# Patient Record
Sex: Male | Born: 1965 | Race: White | Hispanic: No | Marital: Married | Smoking: Former smoker
Health system: Southern US, Community
[De-identification: ages and names within clinical notes are randomized; demographics above are authoritative.]

## PROBLEM LIST (undated history)

## (undated) DIAGNOSIS — J302 Other seasonal allergic rhinitis: Secondary | ICD-10-CM

## (undated) DIAGNOSIS — F32A Depression, unspecified: Secondary | ICD-10-CM

## (undated) DIAGNOSIS — M5020 Other cervical disc displacement, unspecified cervical region: Secondary | ICD-10-CM

## (undated) DIAGNOSIS — E785 Hyperlipidemia, unspecified: Secondary | ICD-10-CM

## (undated) HISTORY — DX: Other cervical disc displacement, unspecified cervical region: M50.20

## (undated) HISTORY — DX: Other seasonal allergic rhinitis: J30.2

## (undated) HISTORY — DX: Hyperlipidemia, unspecified: E78.5

## (undated) HISTORY — DX: Depression, unspecified: F32.A

---

## 2016-03-27 HISTORY — PX: PARTIAL COLECTOMY: SHX5273

## 2016-04-15 DIAGNOSIS — K635 Polyp of colon: Secondary | ICD-10-CM | POA: Insufficient documentation

## 2016-06-08 DIAGNOSIS — C61 Malignant neoplasm of prostate: Secondary | ICD-10-CM | POA: Insufficient documentation

## 2016-06-08 HISTORY — DX: Malignant neoplasm of prostate: C61

## 2018-09-15 ENCOUNTER — Other Ambulatory Visit (HOSPITAL_COMMUNITY): Payer: Self-pay | Admitting: Sports Medicine

## 2018-09-15 ENCOUNTER — Other Ambulatory Visit: Payer: Self-pay | Admitting: Sports Medicine

## 2018-09-15 DIAGNOSIS — M5030 Other cervical disc degeneration, unspecified cervical region: Secondary | ICD-10-CM

## 2018-09-15 DIAGNOSIS — M47812 Spondylosis without myelopathy or radiculopathy, cervical region: Secondary | ICD-10-CM

## 2018-09-15 DIAGNOSIS — M542 Cervicalgia: Secondary | ICD-10-CM

## 2018-09-21 ENCOUNTER — Other Ambulatory Visit: Payer: Self-pay | Admitting: Sports Medicine

## 2018-09-21 DIAGNOSIS — M5030 Other cervical disc degeneration, unspecified cervical region: Secondary | ICD-10-CM

## 2018-09-21 DIAGNOSIS — M47812 Spondylosis without myelopathy or radiculopathy, cervical region: Secondary | ICD-10-CM

## 2018-09-21 DIAGNOSIS — M542 Cervicalgia: Secondary | ICD-10-CM

## 2018-09-23 ENCOUNTER — Ambulatory Visit
Admission: RE | Admit: 2018-09-23 | Discharge: 2018-09-23 | Disposition: A | Discharge status: Home / Self Care | Payer: BC Managed Care – PPO | Source: Ambulatory Visit | Attending: Sports Medicine | Admitting: Sports Medicine

## 2018-09-23 DIAGNOSIS — M542 Cervicalgia: Secondary | ICD-10-CM | POA: Insufficient documentation

## 2018-09-23 DIAGNOSIS — M47812 Spondylosis without myelopathy or radiculopathy, cervical region: Secondary | ICD-10-CM

## 2018-09-23 DIAGNOSIS — M5030 Other cervical disc degeneration, unspecified cervical region: Secondary | ICD-10-CM

## 2019-03-17 IMAGING — CR DG ORBITS FOR FOREIGN BODY
2 series · 2 of 2 positions shown · non-contrast
Comparison: None.

CLINICAL DATA: Metal working/exposure; clearance prior to MRI

EXAM:
ORBITS FOR FOREIGN BODY - 2 VIEW

[orbits waters (1 of 2)]
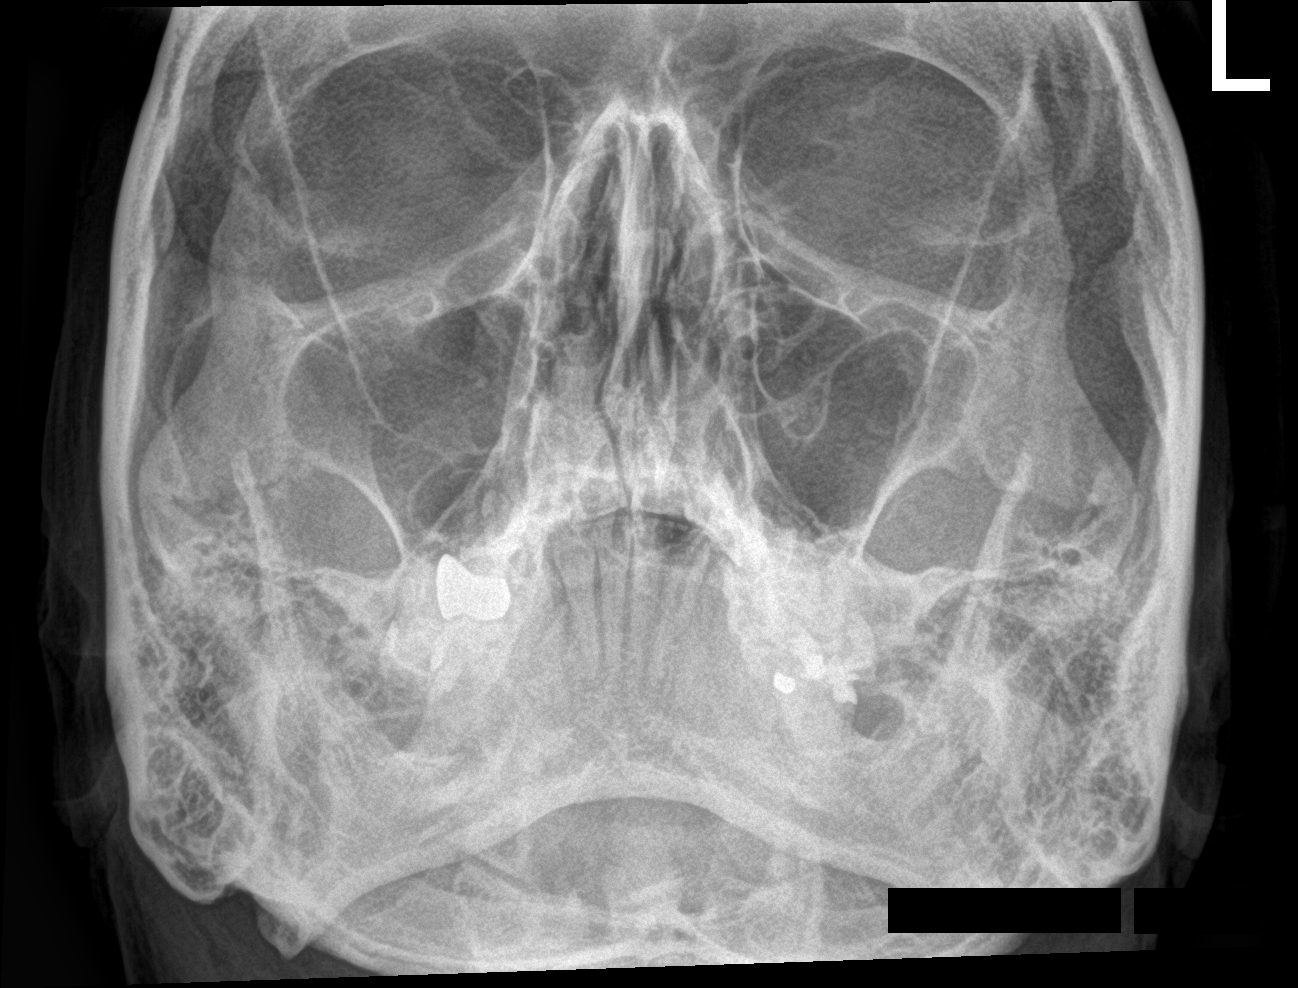

[orbits waters (2 of 2)]
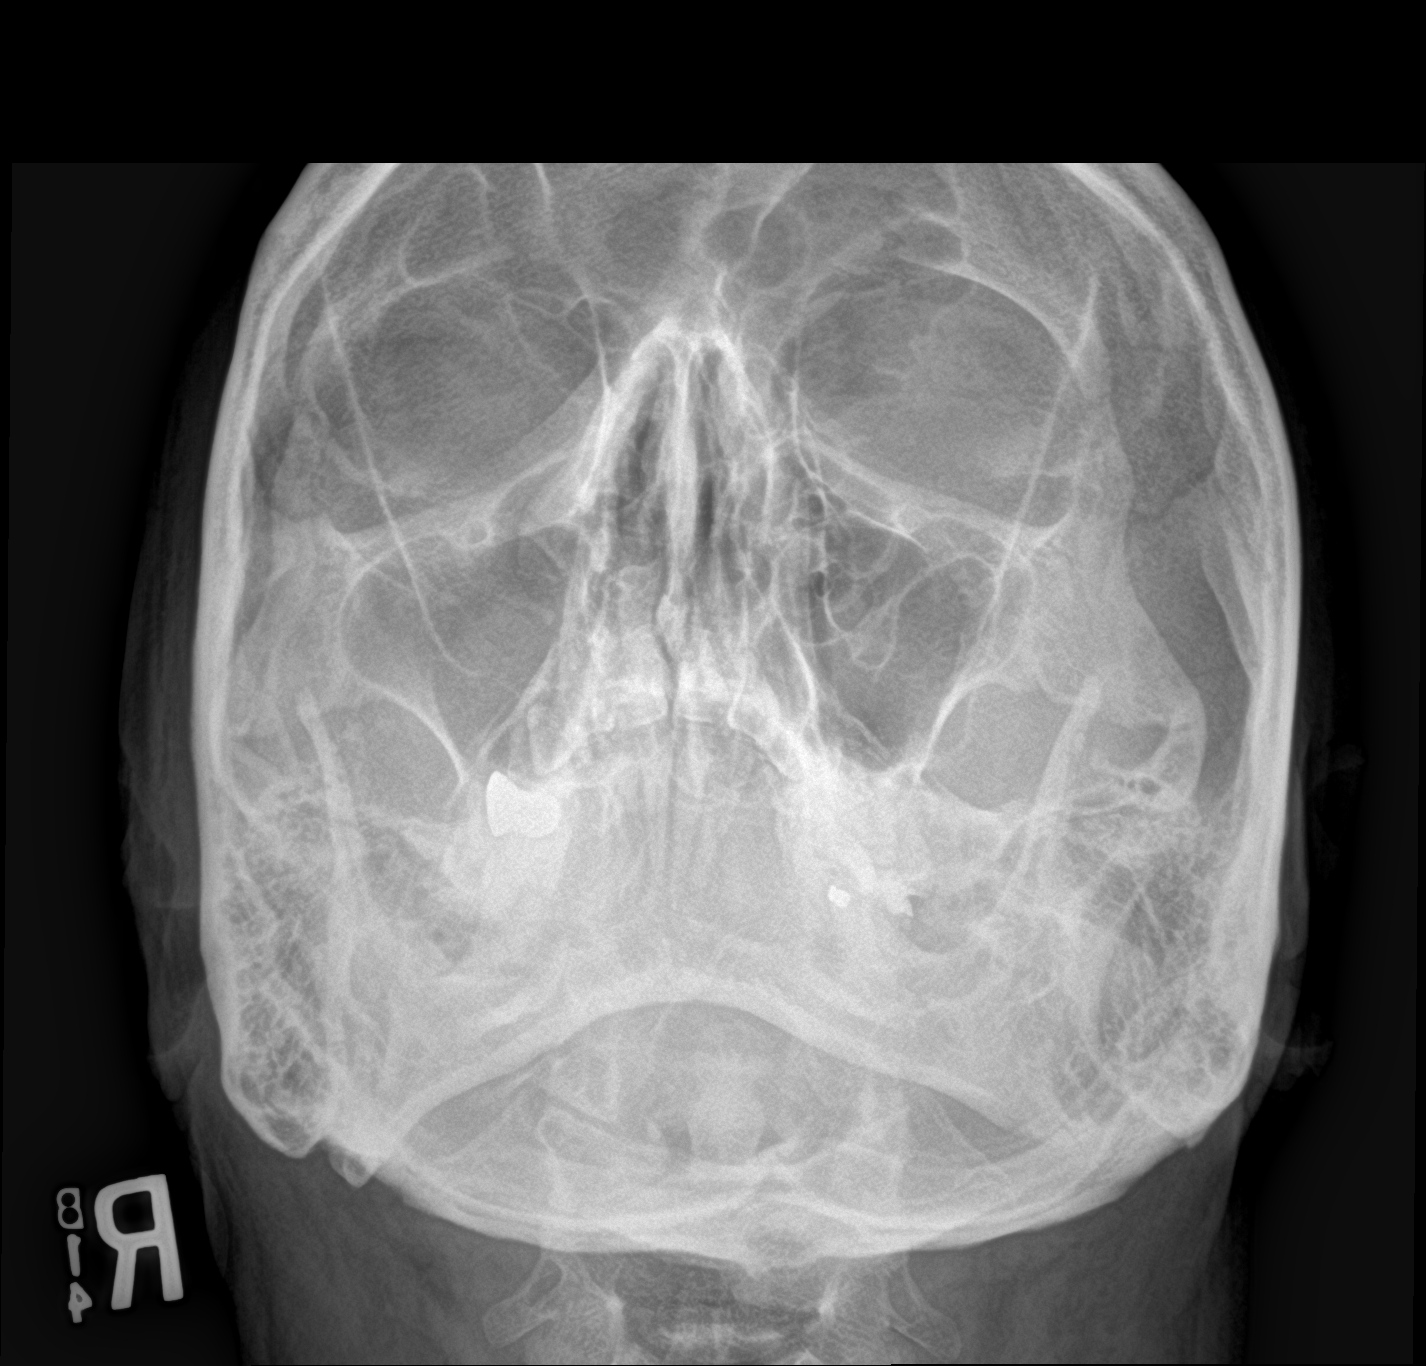

[2 of 2 positions shown; findings below may reference images not displayed]

FINDINGS: There is no evidence of metallic foreign body within the orbits. No
significant bone abnormality identified.
IMPRESSION: No evidence of metallic foreign body within the orbits.

## 2022-09-24 ENCOUNTER — Encounter: Payer: Self-pay | Admitting: Family

## 2022-09-24 ENCOUNTER — Ambulatory Visit (INDEPENDENT_AMBULATORY_CARE_PROVIDER_SITE_OTHER): Payer: BC Managed Care – PPO | Admitting: Family

## 2022-09-24 VITALS — BP 128/70 | HR 67 | Ht 73.0 in | Wt 240.0 lb

## 2022-09-24 DIAGNOSIS — Z6831 Body mass index (BMI) 31.0-31.9, adult: Secondary | ICD-10-CM

## 2022-09-24 DIAGNOSIS — E039 Hypothyroidism, unspecified: Secondary | ICD-10-CM

## 2022-09-24 DIAGNOSIS — I10 Essential (primary) hypertension: Secondary | ICD-10-CM

## 2022-09-24 DIAGNOSIS — R7303 Prediabetes: Secondary | ICD-10-CM

## 2022-09-24 DIAGNOSIS — E538 Deficiency of other specified B group vitamins: Secondary | ICD-10-CM

## 2022-09-24 DIAGNOSIS — C61 Malignant neoplasm of prostate: Secondary | ICD-10-CM

## 2022-09-24 DIAGNOSIS — E782 Mixed hyperlipidemia: Secondary | ICD-10-CM

## 2022-09-24 DIAGNOSIS — E559 Vitamin D deficiency, unspecified: Secondary | ICD-10-CM

## 2022-09-24 DIAGNOSIS — G43119 Migraine with aura, intractable, without status migrainosus: Secondary | ICD-10-CM

## 2022-09-24 HISTORY — DX: Body mass index (BMI) 31.0-31.9, adult: Z68.31

## 2022-09-24 MED ORDER — ROSUVASTATIN CALCIUM 10 MG PO TABS: 10.0000 mg | ORAL_TABLET | Freq: Every day | ORAL | 1 refills | Status: AC

## 2022-09-24 MED ORDER — NURTEC 75 MG PO TBDP: 75.0000 mg | ORAL_TABLET | ORAL | 3 refills | Status: DC | PRN

## 2022-09-24 NOTE — Assessment & Plan Note (Signed)
Checking PSA today.  Will inform pt. Of results.

## 2022-09-24 NOTE — Assessment & Plan Note (Signed)
Nurtec sample given in office.  Will send refills for pt so he can take QOD for prevention.

## 2022-09-24 NOTE — Assessment & Plan Note (Signed)
Checking lipid panel today.  Will send refills for rosuvastatin.   Will consider increase if not well managed.

## 2022-09-24 NOTE — Progress Notes (Signed)
Established Patient Office Visit  Subjective:  Patient ID: Adam Fry, male    DOB: [birth year]  Age: 57 y.o. MRN: [id number]  Chief Complaint  Patient presents with   Follow-up    4 month follow up   Hyperlipidemia   Migraine    Patient is here for his 4 month f/u.  He says that he has a migraine that started when he came in the building today.  He would like to address a few specific concerns today:    Hyperlipidemia This is a chronic problem. The current episode started more than 1 month ago. Condition status: last result was elevated, will re-check today after starting medication. There are no known factors aggravating his hyperlipidemia. Current antihyperlipidemic treatment includes statins. Improvement on treatment: unknown - will check today. There are no compliance problems.  Risk factors for coronary artery disease include male sex and family history.  Migraine  This is a recurrent problem. The current episode started more than 1 year ago. Episode frequency: recently increasing, now approximately once per week, lasting 1-3 days. The problem has been waxing and waning. The pain quality is similar to prior headaches. The quality of the pain is described as throbbing. The pain is at a severity of 7/10. The pain is moderate. Associated symptoms include phonophobia, photophobia and a visual change. The symptoms are aggravated by bright light and weather changes. He has tried acetaminophen, antidepressants, cold packs, darkened room, triptans, NSAIDs and Excedrin (Nurtec is effective) for the symptoms. The treatment provided mild relief. His past medical history is significant for migraine headaches and migraines in the family.   He is due for lab work today, would like to check his PSA as well.   No other concerns at this time.     Past Medical History:  Diagnosis Date   Depression    Herniated disc, cervical    Hyperlipidemia    Prostate cancer ([place]) 06/08/2016   Seasonal  allergies     Social History   Socioeconomic History   Marital status: Married    Spouse name: Not on file   Number of children: Not on file   Years of education: Not on file   Highest education level: Not on file  Occupational History   Not on file  Tobacco Use   Smoking status: Former    Types: Cigarettes    Start date: 50    Quit date: 08/2003    Years since quitting: 19.0   Smokeless tobacco: Never  Substance and Sexual Activity   Alcohol use: Not on file   Drug use: Not Currently   Sexual activity: Not on file  Other Topics Concern   Not on file  Social History Narrative   Not on file   Social Determinants of Health   Financial Resource Strain: Not on file  Food Insecurity: Not on file  Transportation Needs: Not on file  Physical Activity: Not on file  Stress: Not on file  Social Connections: Not on file  Intimate Partner Violence: Not on file    No family history on file.  Allergies  Allergen Reactions   Codeine Nausea And Vomiting and Nausea Only    Stomach cramps    Review of Systems  Eyes:  Positive for photophobia.  All other systems reviewed and are negative.      Objective:   BP 128/70   Pulse 67   Ht '6\' 1"'$  (1.854 m)   Wt 240 lb (108.9 kg)   SpO2  99%   BMI 31.66 kg/m   Vitals:   09/24/22 0901  BP: 128/70  Pulse: 67  Height: '6\' 1"'$  (1.854 m)  Weight: 240 lb (108.9 kg)  SpO2: 99%  BMI (Calculated): 31.67    Physical Exam Vitals and nursing note reviewed.  Constitutional:      General: He is not in acute distress.    Appearance: Normal appearance. He is normal weight. He is not ill-appearing.  HENT:     Head: Normocephalic and atraumatic.  Eyes:     Extraocular Movements: Extraocular movements intact.     Pupils: Pupils are equal, round, and reactive to light.  Cardiovascular:     Rate and Rhythm: Normal rate and regular rhythm.     Pulses: Normal pulses.     Heart sounds: Normal heart sounds.  Pulmonary:     Effort:  Pulmonary effort is normal.     Breath sounds: Normal breath sounds.  Neurological:     General: No focal deficit present.     Mental Status: He is alert and oriented to person, place, and time.  Psychiatric:        Mood and Affect: Mood normal.        Behavior: Behavior normal.      No results found for any visits on 09/24/22.  No results found for this or any previous visit (from the past 2160 hour(s)).    Assessment & Plan:   Problem List Items Addressed This Visit     Prostate cancer ([place])    Checking PSA today.  Will inform pt. Of results.       Relevant Orders   PSA   Vitamin D deficiency, unspecified - Primary    Checking Vitamin D level today in office.   Will supplement if needed for him.       Relevant Orders   VITAMIN D 25 Hydroxy (Vit-D Deficiency, Fractures)   Mixed hyperlipidemia    Checking lipid panel today.  Will send refills for rosuvastatin.   Will consider increase if not well managed.       Relevant Medications   rosuvastatin (CRESTOR) 10 MG tablet   Other Relevant Orders   Lipid panel   Migraine with aura, intractable, without status migrainosus    Nurtec sample given in office.  Will send refills for pt so he can take QOD for prevention.       Relevant Medications   rosuvastatin (CRESTOR) 10 MG tablet   Rimegepant Sulfate (NURTEC) 75 MG TBDP   Body mass index (BMI) of 31.0-31.9 in adult   Other Visit Diagnoses     B12 deficiency due to diet       Relevant Orders   Vitamin B12   Essential hypertension, benign       Relevant Medications   rosuvastatin (CRESTOR) 10 MG tablet   Other Relevant Orders   CBC With Differential   CMP14+EGFR   Hypothyroidism (acquired)       Prediabetes       Relevant Orders   Hemoglobin A1c   Vitamin B12       Return in about 4 months (around 01/23/2023).   Total time spent: 20 minutes  Mechele Claude, FNP  09/24/2022

## 2022-09-24 NOTE — Assessment & Plan Note (Signed)
Checking Vitamin D level today in office.   Will supplement if needed for him.

## 2022-09-25 LAB — CBC WITH DIFFERENTIAL
Basophils Absolute: 0 10*3/uL (ref 0.0–0.2)
Basos: 1 %
EOS (ABSOLUTE): 0.1 10*3/uL (ref 0.0–0.4)
Eos: 2 %
Hematocrit: 46 % (ref 37.5–51.0)
Hemoglobin: 15.8 g/dL (ref 13.0–17.7)
Immature Grans (Abs): 0 10*3/uL (ref 0.0–0.1)
Immature Granulocytes: 0 %
Lymphocytes Absolute: 2.2 10*3/uL (ref 0.7–3.1)
Lymphs: 36 %
MCH: 31.1 pg (ref 26.6–33.0)
MCHC: 34.3 g/dL (ref 31.5–35.7)
MCV: 91 fL (ref 79–97)
Monocytes Absolute: 0.4 10*3/uL (ref 0.1–0.9)
Monocytes: 7 %
Neutrophils Absolute: 3.2 10*3/uL (ref 1.4–7.0)
Neutrophils: 54 %
RBC: 5.08 x10E6/uL (ref 4.14–5.80)
RDW: 12.7 % (ref 11.6–15.4)
WBC: 6 10*3/uL (ref 3.4–10.8)

## 2022-09-25 LAB — VITAMIN B12: Vitamin B-12: 379 pg/mL (ref 232–1245)

## 2022-09-25 LAB — CMP14+EGFR
ALT: 43 IU/L (ref 0–44)
AST: 21 IU/L (ref 0–40)
Albumin/Globulin Ratio: 2.2 (ref 1.2–2.2)
Albumin: 4.7 g/dL (ref 3.8–4.9)
Alkaline Phosphatase: 47 IU/L (ref 44–121)
BUN/Creatinine Ratio: 12 (ref 9–20)
BUN: 13 mg/dL (ref 6–24)
Bilirubin Total: 0.8 mg/dL (ref 0.0–1.2)
CO2: 25 mmol/L (ref 20–29)
Calcium: 9.4 mg/dL (ref 8.7–10.2)
Chloride: 103 mmol/L (ref 96–106)
Creatinine, Ser: 1.09 mg/dL (ref 0.76–1.27)
Globulin, Total: 2.1 g/dL (ref 1.5–4.5)
Glucose: 91 mg/dL (ref 70–99)
Potassium: 4.6 mmol/L (ref 3.5–5.2)
Sodium: 143 mmol/L (ref 134–144)
Total Protein: 6.8 g/dL (ref 6.0–8.5)
eGFR: 79 mL/min/{1.73_m2} (ref >59)

## 2022-09-25 LAB — LIPID PANEL
Chol/HDL Ratio: 3 ratio (ref 0.0–5.0)
Cholesterol, Total: 135 mg/dL (ref 100–199)
HDL: 45 mg/dL (ref >39)
LDL Chol Calc (NIH): 70 mg/dL (ref 0–99)
Triglycerides: 111 mg/dL (ref 0–149)
VLDL Cholesterol Cal: 20 mg/dL (ref 5–40)

## 2022-09-25 LAB — PSA: Prostate Specific Ag, Serum: 0.2 ng/mL (ref 0.0–4.0)

## 2022-09-25 LAB — VITAMIN D 25 HYDROXY (VIT D DEFICIENCY, FRACTURES): Vit D, 25-Hydroxy: 59.2 ng/mL (ref 30.0–100.0)

## 2022-09-25 LAB — HEMOGLOBIN A1C
Est. average glucose Bld gHb Est-mCnc: 120 mg/dL
Hgb A1c MFr Bld: 5.8 % — ABNORMAL HIGH (ref 4.8–5.6)

## 2022-12-01 ENCOUNTER — Other Ambulatory Visit: Payer: Self-pay

## 2022-12-01 DIAGNOSIS — E782 Mixed hyperlipidemia: Secondary | ICD-10-CM

## 2022-12-21 ENCOUNTER — Other Ambulatory Visit: Payer: Self-pay

## 2022-12-21 MED ORDER — NURTEC 75 MG PO TBDP: 75.0000 mg | ORAL_TABLET | ORAL | 3 refills | Status: DC | PRN

## 2023-01-22 ENCOUNTER — Encounter: Payer: Self-pay | Admitting: Family

## 2023-01-22 ENCOUNTER — Ambulatory Visit (INDEPENDENT_AMBULATORY_CARE_PROVIDER_SITE_OTHER): Payer: BC Managed Care – PPO | Admitting: Family

## 2023-01-22 VITALS — BP 122/90 | HR 65 | Ht 73.0 in | Wt 237.6 lb

## 2023-01-22 DIAGNOSIS — R7303 Prediabetes: Secondary | ICD-10-CM

## 2023-01-22 DIAGNOSIS — E782 Mixed hyperlipidemia: Secondary | ICD-10-CM

## 2023-01-22 DIAGNOSIS — E039 Hypothyroidism, unspecified: Secondary | ICD-10-CM

## 2023-01-22 DIAGNOSIS — C61 Malignant neoplasm of prostate: Secondary | ICD-10-CM

## 2023-01-22 DIAGNOSIS — E559 Vitamin D deficiency, unspecified: Secondary | ICD-10-CM

## 2023-01-22 DIAGNOSIS — E538 Deficiency of other specified B group vitamins: Secondary | ICD-10-CM

## 2023-01-22 DIAGNOSIS — I10 Essential (primary) hypertension: Secondary | ICD-10-CM | POA: Diagnosis not present

## 2023-01-22 DIAGNOSIS — Z6831 Body mass index (BMI) 31.0-31.9, adult: Secondary | ICD-10-CM

## 2023-01-22 NOTE — Assessment & Plan Note (Signed)
Continue current meds.  Will adjust as needed based on results.  The patient is asked to make an attempt to improve diet and exercise patterns to aid in medical management of this problem. Addressed importance of increasing and maintaining water intake.   

## 2023-01-22 NOTE — Assessment & Plan Note (Signed)
Checking labs today.  Continue current therapy for lipid control. Will modify as needed based on labwork results.  

## 2023-01-22 NOTE — Progress Notes (Signed)
Established Patient Office Visit  Subjective:  Patient ID: Adam Fry, male    DOB: [birth year]  Age: 57 y.o. MRN: [id number]  Chief Complaint  Patient presents with   Follow-up    4 mo F/U    Patient is here today for his 3 months follow up.  He has been feeling well since last appointment.   He does not have additional concerns to discuss today.  Labs are due today. He needs refills.   I have reviewed his active problem list, medication list, allergies, health maintenance, notes from last encounter, lab results for his appointment today.    No other concerns at this time.   Past Medical History:  Diagnosis Date   Depression    Herniated disc, cervical    Hyperlipidemia    Prostate cancer ([facility]) 06/08/2016   Seasonal allergies     Past Surgical History:  Procedure Laterality Date   PARTIAL COLECTOMY  03/2016   [facility]    Social History   Socioeconomic History   Marital status: Married    Spouse name: Not on file   Number of children: Not on file   Years of education: Not on file   Highest education level: Not on file  Occupational History   Not on file  Tobacco Use   Smoking status: Former    Types: Cigarettes    Start date: 11    Quit date: 08/2003    Years since quitting: 19.4   Smokeless tobacco: Never  Substance and Sexual Activity   Alcohol use: Not on file   Drug use: Not Currently   Sexual activity: Not on file  Other Topics Concern   Not on file  Social History Narrative   Not on file   Social Determinants of Health   Financial Resource Strain: Not on file  Food Insecurity: Not on file  Transportation Needs: Not on file  Physical Activity: Not on file  Stress: Not on file  Social Connections: Not on file  Intimate Partner Violence: Not on file    History reviewed. No pertinent family history.  Allergies  Allergen Reactions   Codeine Nausea And Vomiting and Nausea Only    Stomach cramps    Review of Systems  All other  systems reviewed and are negative.      Objective:   BP (!) 122/90   Pulse 65   Ht 6\' 1"  (1.854 m)   Wt 237 lb 9.6 oz (107.8 kg)   SpO2 97%   BMI 31.35 kg/m   Vitals:   01/22/23 0857  BP: (!) 122/90  Pulse: 65  Height: 6\' 1"  (1.854 m)  Weight: 237 lb 9.6 oz (107.8 kg)  SpO2: 97%  BMI (Calculated): 31.35    Physical Exam Vitals and nursing note reviewed.  Constitutional:      Appearance: Normal appearance. He is normal weight.  Eyes:     Extraocular Movements: Extraocular movements intact.     Conjunctiva/sclera: Conjunctivae normal.     Pupils: Pupils are equal, round, and reactive to light.  Cardiovascular:     Rate and Rhythm: Normal rate and regular rhythm.     Pulses: Normal pulses.     Heart sounds: Normal heart sounds.  Pulmonary:     Effort: Pulmonary effort is normal.     Breath sounds: Normal breath sounds.  Neurological:     General: No focal deficit present.     Mental Status: He is alert and oriented to person, place, and  time. Mental status is at baseline.  Psychiatric:        Mood and Affect: Mood normal.        Behavior: Behavior normal.        Thought Content: Thought content normal.        Judgment: Judgment normal.      No results found for any visits on 01/22/23.  No results found for this or any previous visit (from the past 2160 hour(s)).     Assessment & Plan:   Problem List Items Addressed This Visit       Active Problems   Prostate cancer [facility])    Patient stable.  He is in remission Continue current therapy.      Vitamin D deficiency, unspecified    Checking labs today.  Will continue supplements as needed.       Relevant Orders   VITAMIN D 25 Hydroxy (Vit-D Deficiency, Fractures)   CBC With Differential   CMP14+EGFR   Mixed hyperlipidemia    Checking labs today.  Continue current therapy for lipid control. Will modify as needed based on labwork results.       Relevant Orders   Lipid panel   CBC With  Differential   CMP14+EGFR   Body mass index (BMI) of 31.0-31.9 in adult    Continue current meds.  Will adjust as needed based on results.  The patient is asked to make an attempt to improve diet and exercise patterns to aid in medical management of this problem. Addressed importance of increasing and maintaining water intake.       Essential hypertension, benign    Blood pressure well controlled with current medications.  Continue current therapy.  Will reassess at follow up.       Relevant Orders   CBC With Differential   CMP14+EGFR   Other Visit Diagnoses     B12 deficiency due to diet    -  Primary   Relevant Orders   CBC With Differential   CMP14+EGFR   Vitamin B12   Hypothyroidism (acquired)       Patient stable.  He is in remission Continue current therapy   Relevant Orders   CBC With Differential   CMP14+EGFR   TSH   Prediabetes       Labs today.  Patient counseled on dietary choices and verbalized understanding.   Relevant Orders   CBC With Differential   CMP14+EGFR   Hemoglobin A1c        Return in about 6 months (around 07/24/2023) for F/U.   Total time spent: 30 minutes  Miki Kins, FNP  01/22/2023   This document may have been prepared by [facility] Voice Recognition software and as such may include unintentional dictation errors.

## 2023-01-22 NOTE — Assessment & Plan Note (Signed)
Patient stable.  He is in remission Continue current therapy.

## 2023-01-22 NOTE — Assessment & Plan Note (Signed)
Blood pressure well controlled with current medications.  Continue current therapy.  Will reassess at follow up.  

## 2023-01-22 NOTE — Assessment & Plan Note (Signed)
Checking labs today.  Will continue supplements as needed.  

## 2023-01-23 LAB — CBC WITH DIFFERENTIAL
Basophils Absolute: 0 10*3/uL (ref 0.0–0.2)
Basos: 0 %
EOS (ABSOLUTE): 0.1 10*3/uL (ref 0.0–0.4)
Eos: 2 %
Hematocrit: 49.4 % (ref 37.5–51.0)
Hemoglobin: 16.3 g/dL (ref 13.0–17.7)
Immature Grans (Abs): 0 10*3/uL (ref 0.0–0.1)
Immature Granulocytes: 0 %
Lymphocytes Absolute: 1.8 10*3/uL (ref 0.7–3.1)
Lymphs: 35 %
MCH: 30.1 pg (ref 26.6–33.0)
MCHC: 33 g/dL (ref 31.5–35.7)
MCV: 91 fL (ref 79–97)
Monocytes Absolute: 0.6 10*3/uL (ref 0.1–0.9)
Monocytes: 11 %
Neutrophils Absolute: 2.6 10*3/uL (ref 1.4–7.0)
Neutrophils: 52 %
RBC: 5.41 x10E6/uL (ref 4.14–5.80)
RDW: 13 % (ref 11.6–15.4)
WBC: 5.1 10*3/uL (ref 3.4–10.8)

## 2023-01-23 LAB — LIPID PANEL
Chol/HDL Ratio: 3.3 ratio (ref 0.0–5.0)
Cholesterol, Total: 143 mg/dL (ref 100–199)
HDL: 44 mg/dL (ref >39)
LDL Chol Calc (NIH): 76 mg/dL (ref 0–99)
Triglycerides: 131 mg/dL (ref 0–149)
VLDL Cholesterol Cal: 23 mg/dL (ref 5–40)

## 2023-01-23 LAB — CMP14+EGFR
ALT: 40 IU/L (ref 0–44)
AST: 22 IU/L (ref 0–40)
Albumin: 4.6 g/dL (ref 3.8–4.9)
Alkaline Phosphatase: 47 IU/L (ref 44–121)
BUN/Creatinine Ratio: 10 (ref 9–20)
BUN: 11 mg/dL (ref 6–24)
Bilirubin Total: 1 mg/dL (ref 0.0–1.2)
CO2: 25 mmol/L (ref 20–29)
Calcium: 9.3 mg/dL (ref 8.7–10.2)
Chloride: 102 mmol/L (ref 96–106)
Creatinine, Ser: 1.08 mg/dL (ref 0.76–1.27)
Globulin, Total: 2.2 g/dL (ref 1.5–4.5)
Glucose: 94 mg/dL (ref 70–99)
Potassium: 4.5 mmol/L (ref 3.5–5.2)
Sodium: 141 mmol/L (ref 134–144)
Total Protein: 6.8 g/dL (ref 6.0–8.5)
eGFR: 80 mL/min/{1.73_m2} (ref >59)

## 2023-01-23 LAB — HEMOGLOBIN A1C
Est. average glucose Bld gHb Est-mCnc: 117 mg/dL
Hgb A1c MFr Bld: 5.7 % — ABNORMAL HIGH (ref 4.8–5.6)

## 2023-01-23 LAB — VITAMIN B12: Vitamin B-12: 379 pg/mL (ref 232–1245)

## 2023-01-23 LAB — TSH: TSH: 1.63 u[IU]/mL (ref 0.450–4.500)

## 2023-01-23 LAB — VITAMIN D 25 HYDROXY (VIT D DEFICIENCY, FRACTURES): Vit D, 25-Hydroxy: 65 ng/mL (ref 30.0–100.0)

## 2023-04-02 ENCOUNTER — Other Ambulatory Visit: Payer: Self-pay

## 2023-04-02 MED ORDER — NURTEC 75 MG PO TBDP: 75.0000 mg | ORAL_TABLET | ORAL | 3 refills | Status: DC | PRN

## 2023-08-02 ENCOUNTER — Ambulatory Visit (INDEPENDENT_AMBULATORY_CARE_PROVIDER_SITE_OTHER): Payer: 59 | Admitting: Family

## 2023-08-02 ENCOUNTER — Encounter: Payer: Self-pay | Admitting: Family

## 2023-08-02 VITALS — BP 140/88 | HR 67 | Ht 73.0 in | Wt 233.2 lb

## 2023-08-02 DIAGNOSIS — E782 Mixed hyperlipidemia: Secondary | ICD-10-CM | POA: Diagnosis not present

## 2023-08-02 DIAGNOSIS — R7303 Prediabetes: Secondary | ICD-10-CM

## 2023-08-02 DIAGNOSIS — G43119 Migraine with aura, intractable, without status migrainosus: Secondary | ICD-10-CM

## 2023-08-02 DIAGNOSIS — Z6831 Body mass index (BMI) 31.0-31.9, adult: Secondary | ICD-10-CM

## 2023-08-02 DIAGNOSIS — I10 Essential (primary) hypertension: Secondary | ICD-10-CM

## 2023-08-02 DIAGNOSIS — E559 Vitamin D deficiency, unspecified: Secondary | ICD-10-CM

## 2023-08-02 DIAGNOSIS — Z1159 Encounter for screening for other viral diseases: Secondary | ICD-10-CM

## 2023-08-02 DIAGNOSIS — E538 Deficiency of other specified B group vitamins: Secondary | ICD-10-CM

## 2023-08-02 DIAGNOSIS — C61 Malignant neoplasm of prostate: Secondary | ICD-10-CM

## 2023-08-02 DIAGNOSIS — E039 Hypothyroidism, unspecified: Secondary | ICD-10-CM

## 2023-08-02 DIAGNOSIS — Z114 Encounter for screening for human immunodeficiency virus [HIV]: Secondary | ICD-10-CM

## 2023-08-02 MED ORDER — PROPRANOLOL HCL ER 80 MG PO CP24: 80.0000 mg | ORAL_CAPSULE | Freq: Every day | ORAL | 2 refills | Status: AC

## 2023-08-02 NOTE — Assessment & Plan Note (Signed)
Patient stable.  He is in remission Continue current therapy.

## 2023-08-02 NOTE — Assessment & Plan Note (Addendum)
 Patient has been using the nurtec, but the migraines have persisted.  He has been to the eye doctor recently and has a new prescription.   Starting pt on propranolol.

## 2023-08-02 NOTE — Progress Notes (Signed)
 Established Patient Office Visit  Subjective:  Patient ID: Adam Fry, male    DOB: 1965/10/27  Age: 58 y.o. MRN: [id number]  Chief Complaint  Patient presents with   Follow-up    6 month follow up    Patient is here today for his 6 months follow up.  He has been feeling well since last appointment.   He does have additional concerns to discuss today.  Labs are due today. He needs refills.   I have reviewed his active problem list, medication list, allergies, health maintenance, notes from last encounter, lab results for his appointment today.      No other concerns at this time.   Past Medical History:  Diagnosis Date   Depression    Herniated disc, cervical    Hyperlipidemia    Prostate cancer ([facility]) 06/08/2016   Seasonal allergies     Past Surgical History:  Procedure Laterality Date   PARTIAL COLECTOMY  03/2016   [facility]    Social History   Socioeconomic History   Marital status: Married    Spouse name: Not on file   Number of children: Not on file   Years of education: Not on file   Highest education level: Not on file  Occupational History   Not on file  Tobacco Use   Smoking status: Former    Current packs/day: 0.00    Types: Cigarettes    Start date: 29    Quit date: 08/2003    Years since quitting: 19.9   Smokeless tobacco: Never  Substance and Sexual Activity   Alcohol use: Not on file   Drug use: Not Currently   Sexual activity: Not on file  Other Topics Concern   Not on file  Social History Narrative   Not on file   Social Drivers of Health   Financial Resource Strain: Not on file  Food Insecurity: Not on file  Transportation Needs: Not on file  Physical Activity: Not on file  Stress: Not on file  Social Connections: Not on file  Intimate Partner Violence: Not on file    History reviewed. No pertinent family history.  Allergies  Allergen Reactions   Codeine Nausea And Vomiting and Nausea Only    Stomach cramps     Review of Systems  Neurological:  Positive for headaches.  All other systems reviewed and are negative.      Objective:   BP (!) 140/88   Pulse 67   Ht 6' 1 (1.854 m)   Wt 233 lb 3.2 oz (105.8 kg)   SpO2 97%   BMI 30.77 kg/m   Vitals:   08/02/23 0854  BP: (!) 140/88  Pulse: 67  Height: 6' 1 (1.854 m)  Weight: 233 lb 3.2 oz (105.8 kg)  SpO2: 97%  BMI (Calculated): 30.77    Physical Exam Vitals and nursing note reviewed.  Constitutional:      Appearance: Normal appearance. He is obese.  Eyes:     Pupils: Pupils are equal, round, and reactive to light.  Cardiovascular:     Rate and Rhythm: Normal rate and regular rhythm.     Pulses: Normal pulses.     Heart sounds: Normal heart sounds.  Pulmonary:     Effort: Pulmonary effort is normal.     Breath sounds: Normal breath sounds.  Neurological:     General: No focal deficit present.     Mental Status: He is alert and oriented to person, place, and time.  Psychiatric:  Mood and Affect: Mood normal.        Behavior: Behavior normal.      No results found for any visits on 08/02/23.  No results found for this or any previous visit (from the past 2160 hours).     Assessment & Plan:   Problem List Items Addressed This Visit       Cardiovascular and Mediastinum   Migraine with aura, intractable, without status migrainosus   Patient has been using the nurtec, but the migraines have persisted.  He has been to the eye doctor recently and has a new prescription.   Starting pt on propranolol .         Relevant Medications   propranolol  ER (INDERAL  LA) 80 MG 24 hr capsule   Essential hypertension, benign   Blood pressure well controlled with current medications.  Continue current therapy.  Will reassess at follow up.       Relevant Medications   propranolol  ER (INDERAL  LA) 80 MG 24 hr capsule   Other Relevant Orders   CMP14+EGFR   CBC with Diff     Genitourinary   Prostate cancer ([facility])    Patient stable.  He is in remission Continue current therapy.      Relevant Orders   PSA     Other   Vitamin D  deficiency, unspecified   Checking labs today.  Will continue supplements as needed.        Relevant Orders   VITAMIN D  25 Hydroxy (Vit-D Deficiency, Fractures)   CMP14+EGFR   CBC with Diff   Mixed hyperlipidemia - Primary   Checking labs today.  Continue current therapy for lipid control. Will modify as needed based on labwork results.       Relevant Medications   propranolol  ER (INDERAL  LA) 80 MG 24 hr capsule   Other Relevant Orders   Lipid panel   CMP14+EGFR   CBC with Diff   Body mass index (BMI) of 31.0-31.9 in adult   Relevant Orders   CMP14+EGFR   CBC with Diff   Other Visit Diagnoses       Prediabetes       A1C is in prediabetic ranges. Patient counseled on dietary choices and verbalized understanding. Will reassess at follow up after next lab check.   Relevant Orders   CMP14+EGFR   Hemoglobin A1c   CBC with Diff     Hypothyroidism (acquired)       Relevant Medications   propranolol  ER (INDERAL  LA) 80 MG 24 hr capsule   Other Relevant Orders   CMP14+EGFR   TSH   CBC with Diff     B12 deficiency due to diet       Checking labs today.  Will continue supplements as needed.   Relevant Orders   CMP14+EGFR   Vitamin B12   CBC with Diff     Screening for HIV without presence of risk factors       Test ordered in office today. Will call with results.   Relevant Orders   CMP14+EGFR   CBC with Diff   HIV antibody (with reflex)     Need for hepatitis C screening test       Test ordered in office today. Will call with results.   Relevant Orders   CMP14+EGFR   CBC with Diff   Hepatitis C Ab reflex to Quant PCR       Return in about 6 months (around 01/30/2024) for F/U.   Total time spent: 20  minutes  ALAN CHRISTELLA ARRANT, FNP  08/02/2023   This document may have been prepared by [facility] Voice Recognition software and as such may  include unintentional dictation errors.

## 2023-08-02 NOTE — Assessment & Plan Note (Signed)
 Checking labs today.  Will continue supplements as needed.

## 2023-08-02 NOTE — Assessment & Plan Note (Signed)
 Checking labs today.  Continue current therapy for lipid control. Will modify as needed based on labwork results.

## 2023-08-02 NOTE — Assessment & Plan Note (Signed)
 Blood pressure well controlled with current medications.  Continue current therapy.  Will reassess at follow up.

## 2023-08-03 LAB — CBC WITH DIFFERENTIAL/PLATELET
Basophils Absolute: 0 10*3/uL (ref 0.0–0.2)
Basos: 0 %
EOS (ABSOLUTE): 0.1 10*3/uL (ref 0.0–0.4)
Eos: 2 %
Hematocrit: 48.6 % (ref 37.5–51.0)
Hemoglobin: 16.2 g/dL (ref 13.0–17.7)
Immature Grans (Abs): 0 10*3/uL (ref 0.0–0.1)
Immature Granulocytes: 1 %
Lymphocytes Absolute: 1.8 10*3/uL (ref 0.7–3.1)
Lymphs: 36 %
MCH: 31 pg (ref 26.6–33.0)
MCHC: 33.3 g/dL (ref 31.5–35.7)
MCV: 93 fL (ref 79–97)
Monocytes Absolute: 0.5 10*3/uL (ref 0.1–0.9)
Monocytes: 9 %
Neutrophils Absolute: 2.7 10*3/uL (ref 1.4–7.0)
Neutrophils: 52 %
Platelets: 172 10*3/uL (ref 150–450)
RBC: 5.22 x10E6/uL (ref 4.14–5.80)
RDW: 12.5 % (ref 11.6–15.4)
WBC: 5.1 10*3/uL (ref 3.4–10.8)

## 2023-08-03 LAB — HEMOGLOBIN A1C
Est. average glucose Bld gHb Est-mCnc: 117 mg/dL
Hgb A1c MFr Bld: 5.7 % — ABNORMAL HIGH (ref 4.8–5.6)

## 2023-08-03 LAB — PSA: Prostate Specific Ag, Serum: 0.7 ng/mL (ref 0.0–4.0)

## 2023-08-03 LAB — CMP14+EGFR
ALT: 35 [IU]/L (ref 0–44)
AST: 21 [IU]/L (ref 0–40)
Albumin: 4.5 g/dL (ref 3.8–4.9)
Alkaline Phosphatase: 45 [IU]/L (ref 44–121)
BUN/Creatinine Ratio: 10 (ref 9–20)
BUN: 12 mg/dL (ref 6–24)
Bilirubin Total: 1.1 mg/dL (ref 0.0–1.2)
CO2: 25 mmol/L (ref 20–29)
Calcium: 9.3 mg/dL (ref 8.7–10.2)
Chloride: 102 mmol/L (ref 96–106)
Creatinine, Ser: 1.16 mg/dL (ref 0.76–1.27)
Globulin, Total: 2.1 g/dL (ref 1.5–4.5)
Glucose: 94 mg/dL (ref 70–99)
Potassium: 4.5 mmol/L (ref 3.5–5.2)
Sodium: 142 mmol/L (ref 134–144)
Total Protein: 6.6 g/dL (ref 6.0–8.5)
eGFR: 73 mL/min/{1.73_m2} (ref >59)

## 2023-08-03 LAB — VITAMIN B12: Vitamin B-12: 353 pg/mL (ref 232–1245)

## 2023-08-03 LAB — HCV AB W REFLEX TO QUANT PCR: HCV Ab: NONREACTIVE

## 2023-08-03 LAB — TSH: TSH: 1.77 u[IU]/mL (ref 0.450–4.500)

## 2023-08-03 LAB — LIPID PANEL
Chol/HDL Ratio: 5.2 {ratio} — ABNORMAL HIGH (ref 0.0–5.0)
Cholesterol, Total: 239 mg/dL — ABNORMAL HIGH (ref 100–199)
HDL: 46 mg/dL (ref >39)
LDL Chol Calc (NIH): 166 mg/dL — ABNORMAL HIGH (ref 0–99)
Triglycerides: 149 mg/dL (ref 0–149)
VLDL Cholesterol Cal: 27 mg/dL (ref 5–40)

## 2023-08-03 LAB — HCV INTERPRETATION

## 2023-08-03 LAB — HIV ANTIBODY (ROUTINE TESTING W REFLEX): HIV Screen 4th Generation wRfx: NONREACTIVE

## 2023-08-03 LAB — VITAMIN D 25 HYDROXY (VIT D DEFICIENCY, FRACTURES): Vit D, 25-Hydroxy: 65.5 ng/mL (ref 30.0–100.0)

## 2023-08-09 NOTE — Progress Notes (Signed)
 Informed via Mychart message

## 2024-01-25 ENCOUNTER — Other Ambulatory Visit: Payer: Self-pay

## 2024-01-28 MED ORDER — NURTEC 75 MG PO TBDP: 75.0000 mg | ORAL_TABLET | ORAL | 3 refills | Status: DC | PRN

## 2024-01-31 ENCOUNTER — Ambulatory Visit: Payer: 59 | Admitting: Family

## 2024-02-16 ENCOUNTER — Ambulatory Visit (INDEPENDENT_AMBULATORY_CARE_PROVIDER_SITE_OTHER): Admitting: Family

## 2024-02-16 ENCOUNTER — Encounter: Payer: Self-pay | Admitting: Family

## 2024-02-16 VITALS — BP 130/87 | HR 68 | Ht 73.0 in | Wt 226.4 lb

## 2024-02-16 DIAGNOSIS — I10 Essential (primary) hypertension: Secondary | ICD-10-CM

## 2024-02-16 DIAGNOSIS — E538 Deficiency of other specified B group vitamins: Secondary | ICD-10-CM

## 2024-02-16 DIAGNOSIS — C61 Malignant neoplasm of prostate: Secondary | ICD-10-CM

## 2024-02-16 DIAGNOSIS — E782 Mixed hyperlipidemia: Secondary | ICD-10-CM | POA: Diagnosis not present

## 2024-02-16 DIAGNOSIS — E559 Vitamin D deficiency, unspecified: Secondary | ICD-10-CM

## 2024-02-16 DIAGNOSIS — E039 Hypothyroidism, unspecified: Secondary | ICD-10-CM

## 2024-02-16 DIAGNOSIS — K635 Polyp of colon: Secondary | ICD-10-CM

## 2024-02-16 DIAGNOSIS — R7303 Prediabetes: Secondary | ICD-10-CM | POA: Diagnosis not present

## 2024-02-16 NOTE — Assessment & Plan Note (Signed)
 Due for colonoscopy.  Sending referral to Dr. Dennise in [place].   Will defer to them for further treatment.   -Referral to GI

## 2024-02-16 NOTE — Progress Notes (Signed)
 Established Patient Office Visit  Subjective:  Patient ID: Adam Fry, male    DOB: 09-06-1965  Age: 58 y.o. MRN: [id number]  Chief Complaint  Patient presents with   Follow-up    Patient is here today for his 6 months follow up.  He has been feeling fairly well since last appointment.   He does have additional concerns to discuss today.  Labs are due today.  He needs refills.   I have reviewed his active problem list, medication list, allergies, health maintenance, notes from last encounter, lab results for his appointment today.    No other concerns at this time.   Past Medical History:  Diagnosis Date   Depression    Herniated disc, cervical    Hyperlipidemia    Prostate cancer ([facility]) 06/08/2016   Seasonal allergies     Past Surgical History:  Procedure Laterality Date   PARTIAL COLECTOMY  03/2016   [facility]    Social History   Socioeconomic History   Marital status: Married    Spouse name: Not on file   Number of children: Not on file   Years of education: Not on file   Highest education level: Not on file  Occupational History   Not on file  Tobacco Use   Smoking status: Former    Current packs/day: 0.00    Types: Cigarettes    Start date: 32    Quit date: 08/2003    Years since quitting: 20.4   Smokeless tobacco: Never  Substance and Sexual Activity   Alcohol use: Not on file   Drug use: Not Currently   Sexual activity: Not on file  Other Topics Concern   Not on file  Social History Narrative   Not on file   Social Drivers of Health   Financial Resource Strain: Not on file  Food Insecurity: Not on file  Transportation Needs: Not on file  Physical Activity: Not on file  Stress: Not on file  Social Connections: Not on file  Intimate Partner Violence: Not on file    No family history on file.  Allergies  Allergen Reactions   Codeine Nausea And Vomiting and Nausea Only    Stomach cramps    Review of Systems  All other systems  reviewed and are negative.      Objective:   BP 130/87   Pulse 68   Ht 6' 1 (1.854 m)   Wt 226 lb 6.4 oz (102.7 kg)   SpO2 98%   BMI 29.87 kg/m   Vitals:   02/16/24 0858  BP: 130/87  Pulse: 68  Height: 6' 1 (1.854 m)  Weight: 226 lb 6.4 oz (102.7 kg)  SpO2: 98%  BMI (Calculated): 29.88    Physical Exam Vitals and nursing note reviewed.  Constitutional:      Appearance: Normal appearance. He is normal weight.  Eyes:     Pupils: Pupils are equal, round, and reactive to light.  Cardiovascular:     Rate and Rhythm: Normal rate and regular rhythm.     Pulses: Normal pulses.     Heart sounds: Normal heart sounds.  Pulmonary:     Effort: Pulmonary effort is normal.     Breath sounds: Normal breath sounds.  Neurological:     Mental Status: He is alert.  Psychiatric:        Mood and Affect: Mood normal.        Behavior: Behavior normal.      No results found for any  visits on 02/16/24.  No results found for this or any previous visit (from the past 2160 hours).     Assessment & Plan Vitamin D  deficiency, unspecified B12 deficiency due to diet Hypothyroidism (acquired) Checking labs today.  Will continue supplements as needed.   - Vitamin D  - Vitamin B12 - TSH  Essential hypertension, benign Blood pressure well controlled with current medications.  Continue current therapy.  Will reassess at follow up.   - CBC w/Diff - CMP w/eGFR  Mixed hyperlipidemia Checking labs today.  Continue current therapy for lipid control. Will modify as needed based on labwork results.   -CMP w/eGFR -Lipid Panel  Prediabetes A1C Continues to be in prediabetic ranges.  Will reassess at follow up after next lab check.  Patient counseled on dietary choices and verbalized understanding.   -CBC w/Diff -CMP w/eGFR -Hemoglobin A1C  Prostate cancer [facility]) Patient has been cleared for this by Rad Onc, maintaining by checking PSA regularly.   -PSA  Hyperplastic  colonic polyp, unspecified part of colon Due for colonoscopy.  Sending referral to Dr. Dennise in [place].   Will defer to them for further treatment.   -Referral to GI     Return in about 6 months (around 08/18/2024) for F/U.   Total time spent: 30 minutes  ALAN CHRISTELLA ARRANT, FNP  02/16/2024   This document may have been prepared by [facility] Voice Recognition software and as such may include unintentional dictation errors.

## 2024-02-16 NOTE — Assessment & Plan Note (Signed)
 Checking labs today.  Will continue supplements as needed.   - Vitamin D  - Vitamin B12 - TSH

## 2024-02-16 NOTE — Assessment & Plan Note (Signed)
 Patient has been cleared for this by Rad Onc, maintaining by checking PSA regularly.   -PSA

## 2024-02-16 NOTE — Assessment & Plan Note (Signed)
 Checking labs today.  Continue current therapy for lipid control. Will modify as needed based on labwork results.   -CMP w/eGFR -Lipid Panel

## 2024-02-16 NOTE — Assessment & Plan Note (Signed)
 Blood pressure well controlled with current medications.  Continue current therapy.  Will reassess at follow up.   - CBC w/Diff - CMP w/eGFR

## 2024-02-17 LAB — CMP14+EGFR
ALT: 31 IU/L (ref 0–44)
AST: 19 IU/L (ref 0–40)
Albumin: 4.6 g/dL (ref 3.8–4.9)
Alkaline Phosphatase: 48 IU/L (ref 44–121)
BUN/Creatinine Ratio: 7 — ABNORMAL LOW (ref 9–20)
BUN: 9 mg/dL (ref 6–24)
Bilirubin Total: 0.9 mg/dL (ref 0.0–1.2)
CO2: 22 mmol/L (ref 20–29)
Calcium: 9.1 mg/dL (ref 8.7–10.2)
Chloride: 104 mmol/L (ref 96–106)
Creatinine, Ser: 1.31 mg/dL — ABNORMAL HIGH (ref 0.76–1.27)
Globulin, Total: 2.3 g/dL (ref 1.5–4.5)
Glucose: 101 mg/dL — ABNORMAL HIGH (ref 70–99)
Potassium: 4.3 mmol/L (ref 3.5–5.2)
Sodium: 144 mmol/L (ref 134–144)
Total Protein: 6.9 g/dL (ref 6.0–8.5)
eGFR: 63 mL/min/1.73 (ref >59)

## 2024-02-17 LAB — CBC WITH DIFFERENTIAL/PLATELET
Basophils Absolute: 0 x10E3/uL (ref 0.0–0.2)
Basos: 1 %
EOS (ABSOLUTE): 0.1 x10E3/uL (ref 0.0–0.4)
Eos: 2 %
Hematocrit: 50.7 % (ref 37.5–51.0)
Hemoglobin: 16.7 g/dL (ref 13.0–17.7)
Immature Grans (Abs): 0 x10E3/uL (ref 0.0–0.1)
Immature Granulocytes: 0 %
Lymphocytes Absolute: 2 x10E3/uL (ref 0.7–3.1)
Lymphs: 33 %
MCH: 30.9 pg (ref 26.6–33.0)
MCHC: 32.9 g/dL (ref 31.5–35.7)
MCV: 94 fL (ref 79–97)
Monocytes Absolute: 0.4 x10E3/uL (ref 0.1–0.9)
Monocytes: 7 %
Neutrophils Absolute: 3.6 x10E3/uL (ref 1.4–7.0)
Neutrophils: 57 %
Platelets: 165 x10E3/uL (ref 150–450)
RBC: 5.4 x10E6/uL (ref 4.14–5.80)
RDW: 13.2 % (ref 11.6–15.4)
WBC: 6.2 x10E3/uL (ref 3.4–10.8)

## 2024-02-17 LAB — LIPID PANEL
Chol/HDL Ratio: 4 ratio (ref 0.0–5.0)
Cholesterol, Total: 223 mg/dL — ABNORMAL HIGH (ref 100–199)
HDL: 56 mg/dL (ref >39)
LDL Chol Calc (NIH): 149 mg/dL — ABNORMAL HIGH (ref 0–99)
Triglycerides: 101 mg/dL (ref 0–149)
VLDL Cholesterol Cal: 18 mg/dL (ref 5–40)

## 2024-02-17 LAB — VITAMIN B12: Vitamin B-12: 310 pg/mL (ref 232–1245)

## 2024-02-17 LAB — HEMOGLOBIN A1C
Est. average glucose Bld gHb Est-mCnc: 114 mg/dL
Hgb A1c MFr Bld: 5.6 % (ref 4.8–5.6)

## 2024-02-17 LAB — PSA: Prostate Specific Ag, Serum: 0.4 ng/mL (ref 0.0–4.0)

## 2024-02-17 LAB — TSH: TSH: 1.21 u[IU]/mL (ref 0.450–4.500)

## 2024-02-17 LAB — VITAMIN D 25 HYDROXY (VIT D DEFICIENCY, FRACTURES): Vit D, 25-Hydroxy: 64.7 ng/mL (ref 30.0–100.0)

## 2024-02-24 ENCOUNTER — Ambulatory Visit: Payer: Self-pay

## 2024-03-03 ENCOUNTER — Telehealth: Payer: Self-pay

## 2024-03-03 NOTE — Telephone Encounter (Signed)
 Patient LM requesting a PA to be done did not state what medication he was looking for

## 2024-03-10 ENCOUNTER — Other Ambulatory Visit: Payer: Self-pay

## 2024-03-10 MED ORDER — NURTEC 75 MG PO TBDP: 75.0000 mg | ORAL_TABLET | ORAL | 3 refills | Status: DC | PRN

## 2024-05-24 ENCOUNTER — Encounter: Payer: Self-pay | Admitting: Cardiology

## 2024-05-24 ENCOUNTER — Ambulatory Visit
Admission: RE | Admit: 2024-05-24 | Discharge: 2024-05-24 | Disposition: A | Discharge status: Home / Self Care | Source: Ambulatory Visit | Attending: Cardiology | Admitting: Cardiology

## 2024-05-24 ENCOUNTER — Ambulatory Visit
Admission: RE | Admit: 2024-05-24 | Discharge: 2024-05-24 | Disposition: A | Discharge status: Home / Self Care | Attending: Cardiology | Admitting: Cardiology

## 2024-05-24 ENCOUNTER — Ambulatory Visit: Admitting: Cardiology

## 2024-05-24 VITALS — BP 124/78 | HR 75 | Ht 73.0 in | Wt 226.0 lb

## 2024-05-24 DIAGNOSIS — E663 Overweight: Secondary | ICD-10-CM | POA: Diagnosis not present

## 2024-05-24 DIAGNOSIS — I10 Essential (primary) hypertension: Secondary | ICD-10-CM

## 2024-05-24 DIAGNOSIS — M79671 Pain in right foot: Secondary | ICD-10-CM | POA: Insufficient documentation

## 2024-05-24 DIAGNOSIS — E039 Hypothyroidism, unspecified: Secondary | ICD-10-CM | POA: Insufficient documentation

## 2024-05-24 NOTE — Progress Notes (Signed)
 Established Patient Office Visit  Subjective:  Patient ID: Adam Fry, male    DOB: [birth year]  Age: 58 y.o. MRN: [id number]  Chief Complaint  Patient presents with   Follow-up    Right foot pain    Patient in office for an acute visit, complaining of right foot pain, top of foot. Pain started 4-6 weeks ago. Patient does not remember injuring foot. Pain only occurs when he is pointing his foot. Patient has not taken any OTC medication for relief.  Will order an -xray of the foot to rule out fracture. Blood pressure well controlled today.   Foot Injury  The incident occurred more than 1 week ago. There was no injury mechanism. The pain is present in the right foot. The quality of the pain is described as aching. The pain is at a severity of 7/10. The pain is moderate. The pain has been Intermittent since onset. Pertinent negatives include no numbness or tingling. He reports no foreign bodies present. The symptoms are aggravated by movement. He has tried nothing for the symptoms. The treatment provided no relief.    No other concerns at this time.   Past Medical History:  Diagnosis Date   Body mass index (BMI) of 31.0-31.9 in adult 09/24/2022   Depression    Herniated disc, cervical    Hyperlipidemia    Prostate cancer (HCC) 06/08/2016   Seasonal allergies     Past Surgical History:  Procedure Laterality Date   PARTIAL COLECTOMY  03/2016   [facility]    Social History   Socioeconomic History   Marital status: Married    Spouse name: Not on file   Number of children: Not on file   Years of education: Not on file   Highest education level: Not on file  Occupational History   Not on file  Tobacco Use   Smoking status: Former    Current packs/day: 0.00    Types: Cigarettes    Start date: 73    Quit date: 08/2003    Years since quitting: 20.7   Smokeless tobacco: Never  Substance and Sexual Activity   Alcohol use: Not on file   Drug use: Not Currently   Sexual  activity: Not on file  Other Topics Concern   Not on file  Social History Narrative   Not on file   Social Drivers of Health   Financial Resource Strain: Not on file  Food Insecurity: Not on file  Transportation Needs: Not on file  Physical Activity: Not on file  Stress: Not on file  Social Connections: Not on file  Intimate Partner Violence: Not on file    History reviewed. No pertinent family history.  Allergies  Allergen Reactions   Codeine Nausea And Vomiting and Nausea Only    Stomach cramps    Outpatient Medications Prior to Visit  Medication Sig   propranolol  ER (INDERAL  LA) 80 MG 24 hr capsule Take 1 capsule (80 mg total) by mouth daily. (Patient not taking: Reported on 02/16/2024)   Rimegepant Sulfate (NURTEC) 75 MG TBDP Take 1 tablet (75 mg total) by mouth as needed (for migraines, max 1 tablet daily.).   rosuvastatin  (CRESTOR ) 10 MG tablet Take 1 tablet (10 mg total) by mouth daily. (Patient not taking: Reported on 02/16/2024)   No facility-administered medications prior to visit.    Review of Systems  Constitutional: Negative.   HENT: Negative.    Eyes: Negative.   Respiratory: Negative.  Negative for shortness of breath.  Cardiovascular: Negative.  Negative for chest pain.  Gastrointestinal: Negative.  Negative for abdominal pain, constipation and diarrhea.  Genitourinary: Negative.   Musculoskeletal:  Negative for myalgias.       Right foot pain  Skin: Negative.   Neurological: Negative.  Negative for dizziness, tingling, numbness and headaches.  Endo/Heme/Allergies: Negative.   All other systems reviewed and are negative.      Objective:   BP 124/78   Pulse 75   Ht 6' 1 (1.854 m)   Wt 226 lb (102.5 kg)   SpO2 99%   BMI 29.82 kg/m   Vitals:   05/24/24 1026  BP: 124/78  Pulse: 75  Height: 6' 1 (1.854 m)  Weight: 226 lb (102.5 kg)  SpO2: 99%  BMI (Calculated): 29.82    Physical Exam Nursing note reviewed.  Constitutional:       Appearance: Normal appearance. He is normal weight.  HENT:     Head: Normocephalic and atraumatic.     Nose: Nose normal.     Mouth/Throat:     Mouth: Mucous membranes are moist.     Pharynx: Oropharynx is clear.  Eyes:     Extraocular Movements: Extraocular movements intact.     Conjunctiva/sclera: Conjunctivae normal.     Pupils: Pupils are equal, round, and reactive to light.  Cardiovascular:     Rate and Rhythm: Normal rate and regular rhythm.     Pulses: Normal pulses.     Heart sounds: Normal heart sounds.  Pulmonary:     Effort: Pulmonary effort is normal.     Breath sounds: Normal breath sounds.  Abdominal:     General: Abdomen is flat. Bowel sounds are normal.     Palpations: Abdomen is soft.  Musculoskeletal:        General: Normal range of motion.     Cervical back: Normal range of motion.  Skin:    General: Skin is warm and dry.  Neurological:     General: No focal deficit present.     Mental Status: He is alert and oriented to person, place, and time.  Psychiatric:        Mood and Affect: Mood normal.        Behavior: Behavior normal.        Thought Content: Thought content normal.        Judgment: Judgment normal.      No results found for any visits on 05/24/24.  No results found for this or any previous visit (from the past 2160 hours).    Assessment & Plan:   Problem List Items Addressed This Visit       Cardiovascular and Mediastinum   Essential hypertension, benign     Other   Right foot pain - Primary   Relevant Orders   DG Foot Complete Right   Overweight (BMI 25.0-29.9)    Return if symptoms worsen or fail to improve.   Total time spent: 25 minutes. This time includes review of previous notes and results and patient face to face interaction during today's visit.    Jeoffrey Pollen, NP  05/24/2024   This document may have been prepared by Dragon Voice Recognition software and as such may include unintentional dictation errors.

## 2024-05-26 ENCOUNTER — Ambulatory Visit: Payer: Self-pay | Admitting: Cardiology

## 2024-06-25 ENCOUNTER — Other Ambulatory Visit: Payer: Self-pay | Admitting: Family

## 2024-08-18 ENCOUNTER — Encounter: Payer: Self-pay | Admitting: Family

## 2024-08-18 ENCOUNTER — Ambulatory Visit: Admitting: Family

## 2024-08-18 VITALS — BP 118/84 | HR 78 | Ht 73.0 in | Wt 226.8 lb

## 2024-08-18 DIAGNOSIS — E782 Mixed hyperlipidemia: Secondary | ICD-10-CM | POA: Diagnosis not present

## 2024-08-18 DIAGNOSIS — I10 Essential (primary) hypertension: Secondary | ICD-10-CM

## 2024-08-18 DIAGNOSIS — E538 Deficiency of other specified B group vitamins: Secondary | ICD-10-CM | POA: Diagnosis not present

## 2024-08-18 DIAGNOSIS — E039 Hypothyroidism, unspecified: Secondary | ICD-10-CM

## 2024-08-18 DIAGNOSIS — G43119 Migraine with aura, intractable, without status migrainosus: Secondary | ICD-10-CM

## 2024-08-18 DIAGNOSIS — K635 Polyp of colon: Secondary | ICD-10-CM | POA: Diagnosis not present

## 2024-08-18 DIAGNOSIS — R7303 Prediabetes: Secondary | ICD-10-CM | POA: Diagnosis not present

## 2024-08-18 DIAGNOSIS — E559 Vitamin D deficiency, unspecified: Secondary | ICD-10-CM

## 2024-08-18 NOTE — Progress Notes (Signed)
 "  Established Patient Office Visit  Subjective:  Patient ID: Adam Fry, male    DOB: 11/04/1965  Age: 59 y.o. MRN: [id number]  Chief Complaint  Patient presents with   Follow-up    6 month follow up    Patient is here today for his 6 months follow up.  He has been feeling fairly well since last appointment.   He does have additional concerns to discuss today.  He had a recent bout of the flu, a few weeks ago.  Says that he has gotten better mostly, just a little bit of drainage.  He says that he tried a combination of the Nurtec and a home remedy that he tried when he got a migraine last week, says that it did seem to help. Within 10 minutes, the aura he had started was gone, and he got a dull headache on the top of his head, which lasted for a few hours.   Just got a text message that his mom is in the hospital and she isn't eating or drinking much, and is too weak to stand at this point.  His sister (Geriatric NP) is helping with her care.  She does have dementia, which is causing her to not have a short term memory at this point.   He also is due for his colonoscopy.  He has an established care relationship with a provider in [place], asks if we can send a referral there for him.   Labs are due today.  He needs refills.   I have reviewed his active problem list, medication list, allergies, notes from last encounter, lab results for his appointment today.      No other concerns at this time.   Past Medical History:  Diagnosis Date   Body mass index (BMI) of 31.0-31.9 in adult 09/24/2022   Depression    Herniated disc, cervical    Hyperlipidemia    Prostate cancer (HCC) 06/08/2016   Seasonal allergies     Past Surgical History:  Procedure Laterality Date   PARTIAL COLECTOMY  03/2016   [facility]    Social History   Socioeconomic History   Marital status: Married    Spouse name: Not on file   Number of children: Not on file   Years of education: Not on file    Highest education level: Not on file  Occupational History   Not on file  Tobacco Use   Smoking status: Former    Current packs/day: 0.00    Types: Cigarettes    Start date: 34    Quit date: 08/2003    Years since quitting: 20.9   Smokeless tobacco: Never  Substance and Sexual Activity   Alcohol use: Not on file   Drug use: Not Currently   Sexual activity: Not on file  Other Topics Concern   Not on file  Social History Narrative   Not on file   Social Drivers of Health   Tobacco Use: Medium Risk (08/18/2024)   Patient History    Smoking Tobacco Use: Former    Smokeless Tobacco Use: Never    Passive Exposure: Not on Actuary Strain: Not on file  Food Insecurity: Not on file  Transportation Needs: Not on file  Physical Activity: Not on file  Stress: Not on file  Social Connections: Not on file  Intimate Partner Violence: Not on file  Depression (EYV7-0): Not on file  Alcohol Screen: Not on file  Housing: Not on file  Utilities: Not on file  Health Literacy: Not on file    No family history on file.  Allergies[1]  Review of Systems  All other systems reviewed and are negative.      Objective:   BP 118/84   Pulse 78   Ht 6' 1 (1.854 m)   Wt 226 lb 12.8 oz (102.9 kg)   SpO2 99%   BMI 29.92 kg/m   Vitals:   08/18/24 0857  BP: 118/84  Pulse: 78  Height: 6' 1 (1.854 m)  Weight: 226 lb 12.8 oz (102.9 kg)  SpO2: 99%  BMI (Calculated): 29.93    Physical Exam Vitals and nursing note reviewed.  Constitutional:      Appearance: Normal appearance. He is normal weight.  Eyes:     Pupils: Pupils are equal, round, and reactive to light.  Cardiovascular:     Rate and Rhythm: Normal rate and regular rhythm.     Pulses: Normal pulses.     Heart sounds: Normal heart sounds.  Pulmonary:     Effort: Pulmonary effort is normal.     Breath sounds: Normal breath sounds.  Neurological:     Mental Status: He is alert.  Psychiatric:         Mood and Affect: Mood normal.        Behavior: Behavior normal.      No results found for any visits on 08/18/24.  No results found for this or any previous visit (from the past 2160 hours).     Assessment & Plan Essential hypertension, benign Blood pressure well controlled with current medications.  Continue current therapy.  Will reassess at follow up.   - CBC w/Diff - CMP w/eGFR  Mixed hyperlipidemia Checking labs today.  Continue current therapy for lipid control. Will modify as needed based on labwork results.   -CMP w/eGFR -Lipid Panel  Prediabetes A1C Continues to be in prediabetic ranges.  Will reassess at follow up after next lab check.  Patient counseled on dietary choices and verbalized understanding.   -CBC w/Diff -CMP w/eGFR -Hemoglobin A1C  B12 deficiency due to diet Vitamin D  deficiency, unspecified Hypothyroidism (acquired) Checking labs today.  Will continue supplements as needed.   - Vitamin D  - Vitamin B12 - TSH  Polyp of colon, unspecified part of colon, unspecified type Setting patient up for referral to gastroenterology .  Will defer to them for further treatment changes.  Reassess at follow up.  Migraine with aura, intractable, without status migrainosus Patient given Symbravo and Zavzpret samples today.  He will let me know if these are effective for him.      Return in about 6 months (around 02/15/2025).   Total time spent: 20 minutes  ALAN CHRISTELLA ARRANT, FNP  08/18/2024   This document may have been prepared by [facility] Voice Recognition software and as such may include unintentional dictation errors.        [1]  Allergies Allergen Reactions   Codeine Nausea And Vomiting and Nausea Only    Stomach cramps   "

## 2024-08-19 LAB — LIPID PANEL
Chol/HDL Ratio: 4.3 ratio (ref 0.0–5.0)
Cholesterol, Total: 241 mg/dL — ABNORMAL HIGH (ref 100–199)
HDL: 56 mg/dL
LDL Chol Calc (NIH): 159 mg/dL — ABNORMAL HIGH (ref 0–99)
Triglycerides: 142 mg/dL (ref 0–149)
VLDL Cholesterol Cal: 26 mg/dL (ref 5–40)

## 2024-08-19 LAB — CBC WITH DIFFERENTIAL/PLATELET
Basophils Absolute: 0 10*3/uL (ref 0.0–0.2)
Basos: 1 %
EOS (ABSOLUTE): 0.2 10*3/uL (ref 0.0–0.4)
Eos: 2 %
Hematocrit: 49.8 % (ref 37.5–51.0)
Hemoglobin: 16.1 g/dL (ref 13.0–17.7)
Immature Grans (Abs): 0 10*3/uL (ref 0.0–0.1)
Immature Granulocytes: 0 %
Lymphocytes Absolute: 2.1 10*3/uL (ref 0.7–3.1)
Lymphs: 32 %
MCH: 30.7 pg (ref 26.6–33.0)
MCHC: 32.3 g/dL (ref 31.5–35.7)
MCV: 95 fL (ref 79–97)
Monocytes Absolute: 0.5 10*3/uL (ref 0.1–0.9)
Monocytes: 8 %
Neutrophils Absolute: 3.7 10*3/uL (ref 1.4–7.0)
Neutrophils: 56 %
Platelets: 158 10*3/uL (ref 150–450)
RBC: 5.24 x10E6/uL (ref 4.14–5.80)
RDW: 13.2 % (ref 11.6–15.4)
WBC: 6.6 10*3/uL (ref 3.4–10.8)

## 2024-08-19 LAB — CMP14+EGFR
ALT: 31 [IU]/L (ref 0–44)
AST: 19 [IU]/L (ref 0–40)
Albumin: 4.7 g/dL (ref 3.8–4.9)
Alkaline Phosphatase: 51 [IU]/L (ref 47–123)
BUN/Creatinine Ratio: 9 (ref 9–20)
BUN: 11 mg/dL (ref 6–24)
Bilirubin Total: 1.1 mg/dL (ref 0.0–1.2)
CO2: 23 mmol/L (ref 20–29)
Calcium: 9.5 mg/dL (ref 8.7–10.2)
Chloride: 99 mmol/L (ref 96–106)
Creatinine, Ser: 1.26 mg/dL (ref 0.76–1.27)
Globulin, Total: 2.2 g/dL (ref 1.5–4.5)
Glucose: 97 mg/dL (ref 70–99)
Potassium: 4.3 mmol/L (ref 3.5–5.2)
Sodium: 141 mmol/L (ref 134–144)
Total Protein: 6.9 g/dL (ref 6.0–8.5)
eGFR: 66 mL/min/{1.73_m2}

## 2024-08-19 LAB — TSH: TSH: 1.3 u[IU]/mL (ref 0.450–4.500)

## 2024-08-19 LAB — VITAMIN B12: Vitamin B-12: 884 pg/mL (ref 232–1245)

## 2024-08-19 LAB — VITAMIN D 25 HYDROXY (VIT D DEFICIENCY, FRACTURES): Vit D, 25-Hydroxy: 57 ng/mL (ref 30.0–100.0)

## 2024-08-19 LAB — HEMOGLOBIN A1C
Est. average glucose Bld gHb Est-mCnc: 108 mg/dL
Hgb A1c MFr Bld: 5.4 % (ref 4.8–5.6)

## 2024-08-21 NOTE — Assessment & Plan Note (Signed)
 Checking labs today.  Continue current therapy for lipid control. Will modify as needed based on labwork results.   -CMP w/eGFR -Lipid Panel

## 2024-08-21 NOTE — Assessment & Plan Note (Signed)
 Checking labs today.  Will continue supplements as needed.   - Vitamin D  - Vitamin B12 - TSH

## 2024-08-21 NOTE — Assessment & Plan Note (Addendum)
 Setting patient up for referral to gastroenterology. Will defer to them for further treatment changes.  Reassess at follow up.

## 2024-08-21 NOTE — Assessment & Plan Note (Signed)
 Blood pressure well controlled with current medications.  Continue current therapy.  Will reassess at follow up.   - CBC w/Diff - CMP w/eGFR

## 2024-08-21 NOTE — Assessment & Plan Note (Addendum)
 Patient given Symbravo and Zavzpret samples today.  He will let me know if these are effective for him.

## 2024-08-31 ENCOUNTER — Ambulatory Visit: Payer: Self-pay

## 2025-02-15 ENCOUNTER — Ambulatory Visit: Admitting: Family
